# Patient Record
Sex: Female | Born: 2005 | Race: White | Hispanic: No | Marital: Single | Smoking: Never smoker
Health system: Southern US, Community
[De-identification: ages and names within clinical notes are randomized; demographics above are authoritative.]

## PROBLEM LIST (undated history)

## (undated) DIAGNOSIS — F32A Depression, unspecified: Secondary | ICD-10-CM

---
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
## 2024-01-19 ENCOUNTER — Ambulatory Visit (HOSPITAL_COMMUNITY)
Admission: RE | Admit: 2024-01-19 | Discharge: 2024-01-19 | Disposition: A | Discharge status: Home / Self Care | Payer: MEDICAID | Source: Ambulatory Visit | Attending: Family Medicine | Admitting: Family Medicine

## 2024-01-19 ENCOUNTER — Encounter (HOSPITAL_COMMUNITY): Payer: Self-pay

## 2024-01-19 VITALS — BP 126/81 | HR 80 | Temp 98.1°F | Resp 18

## 2024-01-19 DIAGNOSIS — R1013 Epigastric pain: Secondary | ICD-10-CM | POA: Diagnosis not present

## 2024-01-19 DIAGNOSIS — Z3202 Encounter for pregnancy test, result negative: Secondary | ICD-10-CM | POA: Diagnosis not present

## 2024-01-19 HISTORY — DX: Depression, unspecified: F32.A

## 2024-01-19 LAB — CBC WITH DIFFERENTIAL/PLATELET
Abs Immature Granulocytes: 0.02 10*3/uL (ref 0.00–0.07)
Basophils Absolute: 0 10*3/uL (ref 0.0–0.1)
Basophils Relative: 0 %
Eosinophils Absolute: 0.2 10*3/uL (ref 0.0–0.5)
Eosinophils Relative: 2 %
HCT: 39.9 % (ref 36.0–46.0)
Hemoglobin: 14 g/dL (ref 12.0–15.0)
Immature Granulocytes: 0 %
Lymphocytes Relative: 28 %
Lymphs Abs: 3 10*3/uL (ref 0.7–4.0)
MCH: 30.6 pg (ref 26.0–34.0)
MCHC: 35.1 g/dL (ref 30.0–36.0)
MCV: 87.1 fL (ref 80.0–100.0)
Monocytes Absolute: 0.9 10*3/uL (ref 0.1–1.0)
Monocytes Relative: 8 %
Neutro Abs: 6.5 10*3/uL (ref 1.7–7.7)
Neutrophils Relative %: 62 %
Platelets: 356 10*3/uL (ref 150–400)
RBC: 4.58 MIL/uL (ref 3.87–5.11)
RDW: 11.9 % (ref 11.5–15.5)
WBC: 10.7 10*3/uL — ABNORMAL HIGH (ref 4.0–10.5)
nRBC: 0 % (ref 0.0–0.2)

## 2024-01-19 LAB — POCT URINALYSIS DIP (MANUAL ENTRY)
Glucose, UA: NEGATIVE mg/dL
Leukocytes, UA: NEGATIVE
Nitrite, UA: NEGATIVE
Protein Ur, POC: >=300 mg/dL — AB
Spec Grav, UA: 1.025
Urobilinogen, UA: 2 U/dL — AB
pH, UA: 6

## 2024-01-19 LAB — POCT URINE PREGNANCY: Preg Test, Ur: NEGATIVE

## 2024-01-19 LAB — LIPASE, BLOOD: Lipase: 22 U/L (ref 11–51)

## 2024-01-19 LAB — COMPREHENSIVE METABOLIC PANEL
ALT: 16 U/L (ref 0–44)
AST: 17 U/L (ref 15–41)
Albumin: 3.7 g/dL (ref 3.5–5.0)
Alkaline Phosphatase: 79 U/L (ref 38–126)
Anion gap: 9 (ref 5–15)
BUN: 11 mg/dL (ref 6–20)
CO2: 22 mmol/L (ref 22–32)
Calcium: 9.5 mg/dL (ref 8.9–10.3)
Chloride: 106 mmol/L (ref 98–111)
Creatinine, Ser: 0.82 mg/dL (ref 0.44–1.00)
GFR, Estimated: >60 mL/min (ref >60)
Glucose, Bld: 89 mg/dL (ref 70–99)
Potassium: 3.5 mmol/L (ref 3.5–5.1)
Sodium: 137 mmol/L (ref 135–145)
Total Bilirubin: 0.5 mg/dL (ref 0.0–1.2)
Total Protein: 8 g/dL (ref 6.5–8.1)

## 2024-01-19 MED ORDER — FAMOTIDINE 40 MG PO TABS: 40.0000 mg | ORAL_TABLET | Freq: Every day | ORAL | 0 refills | Status: DC

## 2024-01-19 NOTE — ED Provider Notes (Signed)
 [facility]   [id number] 01/19/24 Arrival Time: 1450  ASSESSMENT & PLAN:  1. Epigastric pain    Benign abdominal exam. No indications for urgent abdominal/pelvic imaging at this time. Discussed.  Meds ordered this encounter  Medications   famotidine (PEPCID) 40 MG tablet    Sig: Take 1 tablet (40 mg total) by mouth daily.    Dispense:  30 tablet    Refill:  0     Discharge Instructions      You have been seen today for abdominal pain. Your evaluation was not suggestive of any emergent condition requiring medical intervention at this time. However, some abdominal problems make take more time to appear. Therefore, it is very important for you to pay attention to any new symptoms or worsening of your current condition.  Please return here or to the Emergency Department immediately should you begin to feel worse in any way or have any of the following symptoms: increasing or different abdominal pain, persistent vomiting, inability to drink fluids, fevers, or shaking chills.   You have had labs (blood tests) sent today. We will call you with any significant abnormalities or if there is need to begin or change treatment or pursue further follow up.  You may also review your test results online through MyChart. If you do not have a MyChart account, instructions to sign up should be on your discharge paperwork.     Follow-up Information     Schedule an appointment as soon as possible for a visit  with Cathy Sessions, NP.   Specialty: Internal Medicine Why: For follow up. Contact information: [address] [place] [address] [phone]                 Reviewed expectations re: course of current medical issues. Questions answered. Outlined signs and symptoms indicating need for more acute intervention. Patient verbalized understanding. After Visit Summary given.   SUBJECTIVE: History from: patient. Cathy Heath is a 18 y.o. female who  presents with complaint of intermittent non-radiating epigastric abdominal discomfort. Long-standing; mainly post-prandial. Mother with h/o gallbladder problems. Pt feels like she has lost some weight secondary to symptoms. Denies fever/n/v/d. Does have occasional food reflux into mouth. Denies frequent belching.  Non-smoker. Denies alcohol and recreational drug use.  No LMP recorded. Patient has had an implant. History reviewed. No pertinent surgical history.   OBJECTIVE:  Vitals:   01/19/24 1534  BP: 126/81  Pulse: 80  Resp: 18  Temp: 98.1 F (36.7 C)  TempSrc: Oral  SpO2: 98%    General appearance: alert, oriented, no acute distress HEENT: ; AT; oropharynx moist Lungs: unlabored respirations Abdomen: soft; without distention; mild  epigastric TTP; normal bowel sounds; without masses or organomegaly; without guarding or rebound tenderness Back: without reported CVA tenderness; FROM at waist Extremities: without LE edema; symmetrical; without gross deformities Skin: warm and dry Neurologic: normal gait Psychological: alert and cooperative; normal mood and affect  Labs: Results for orders placed or performed during the hospital encounter of 01/19/24  POC urinalysis dipstick   Collection Time: 01/19/24  4:13 PM  Result Value Ref Range   Color, UA yellow    Clarity, UA clear    Glucose, UA negative mg/dL   Bilirubin, UA small (A)    Ketones, POC UA small (15) (A) mg/dL   Spec Grav, UA 9.518    Blood, UA moderate (A)    pH, UA 6.0    Protein Ur, POC >=300 (A) mg/dL  Urobilinogen, UA 2.0 (A) E.U./dL   Nitrite, UA Negative    Leukocytes, UA Negative   POCT urine pregnancy   Collection Time: 01/19/24  4:15 PM  Result Value Ref Range   Preg Test, Ur Negative Negative   Labs Reviewed  POCT URINALYSIS DIP (MANUAL ENTRY) - Abnormal; Notable for the following components:      Result Value   Bilirubin, UA small (*)    Ketones, POC UA small (15) (*)    Blood, UA moderate  (*)    Protein Ur, POC >=300 (*)    Urobilinogen, UA 2.0 (*)    All other components within normal limits  CBC WITH DIFFERENTIAL/PLATELET  COMPREHENSIVE METABOLIC PANEL  LIPASE, BLOOD  POCT URINE PREGNANCY    Imaging: No results found.   No Known Allergies                                             Past Medical History:  Diagnosis Date   Depression     Social History   Socioeconomic History   Marital status: Single    Spouse name: Not on file   Number of children: Not on file   Years of education: Not on file   Highest education level: Not on file  Occupational History   Not on file  Tobacco Use   Smoking status: Never   Smokeless tobacco: Never  Vaping Use   Vaping status: Every Day  Substance and Sexual Activity   Alcohol use: Yes    Comment: rare   Drug use: Yes    Types: Marijuana    Comment: 1-2 per week   Sexual activity: Yes    Partners: Male    Birth control/protection: Implant  Other Topics Concern   Not on file  Social History Narrative   Not on file   Social Drivers of Health   Financial Resource Strain: Not on file  Food Insecurity: No Food Insecurity (11/11/2023)   Hunger Vital Sign    Worried About Running Out of Food in the Last Year: Never true    Ran Out of Food in the Last Year: Never true  Transportation Needs: No Transportation Needs (11/11/2023)   PRAPARE - Administrator, Civil Service (Medical): No    Lack of Transportation (Non-Medical): No  Physical Activity: Not on file  Stress: Not on file  Social Connections: Not on file  Intimate Partner Violence: Not At Risk (11/11/2023)   Humiliation, Afraid, Rape, and Kick questionnaire    Fear of Current or Ex-Partner: No    Emotionally Abused: No    Physically Abused: No    Sexually Abused: No    Family History  Problem Relation Age of Onset   Healthy Mother    Hypertension Father      Mardella Layman, MD 01/19/24 [phone]

## 2024-01-19 NOTE — Discharge Instructions (Signed)

## 2024-01-19 NOTE — ED Triage Notes (Signed)
 Pt states that since last summer she has abdominal pain when she eats certain things. She states it takes a long time to get into her pcp so she just came here. She states that her mom wants her gallbladder checked. She has been taking tylenol and IBU

## 2024-01-28 ENCOUNTER — Other Ambulatory Visit: Payer: Self-pay

## 2024-01-28 ENCOUNTER — Emergency Department (HOSPITAL_COMMUNITY): Payer: MEDICAID

## 2024-01-28 ENCOUNTER — Encounter (HOSPITAL_COMMUNITY): Payer: Self-pay

## 2024-01-28 ENCOUNTER — Emergency Department (HOSPITAL_COMMUNITY)
Admission: EM | Admit: 2024-01-28 | Discharge: 2024-01-29 | Disposition: A | Discharge status: Home / Self Care | Payer: MEDICAID | Attending: Emergency Medicine | Admitting: Emergency Medicine

## 2024-01-28 DIAGNOSIS — Y92410 Unspecified street and highway as the place of occurrence of the external cause: Secondary | ICD-10-CM | POA: Insufficient documentation

## 2024-01-28 DIAGNOSIS — S0990XA Unspecified injury of head, initial encounter: Secondary | ICD-10-CM | POA: Diagnosis present

## 2024-01-28 DIAGNOSIS — M542 Cervicalgia: Secondary | ICD-10-CM | POA: Insufficient documentation

## 2024-01-28 MED ORDER — ACETAMINOPHEN 325 MG PO TABS
650.0000 mg | ORAL_TABLET | Freq: Once | ORAL | Status: AC
  Administered 2024-01-28: 650 mg via ORAL
  Filled 2024-01-28: qty 2
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
## 2024-01-29 MED ORDER — KETOROLAC TROMETHAMINE 60 MG/2ML IM SOLN
30.0000 mg | Freq: Once | INTRAMUSCULAR | Status: AC
  Administered 2024-01-29: 30 mg via INTRAMUSCULAR
  Filled 2024-01-29: qty 2

## 2024-01-29 NOTE — Discharge Instructions (Addendum)
 For pain control you may take 1000 mg of acetaminophen (Tylenol) every 8 hours and/or 600 mg of Ibuprofen (Motrin, Advil, etc.) every 6-8 hours as needed.  Please limit acetaminophen (Tylenol) to 4000 mg and Ibuprofen (Motrin, Advil, etc.) to 2400 mg for a 24hr period. Please note that other over-the-counter medicine may contain acetaminophen or ibuprofen as a component of their ingredients.
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
## 2024-02-11 ENCOUNTER — Ambulatory Visit (HOSPITAL_COMMUNITY)
Admission: EM | Admit: 2024-02-11 | Discharge: 2024-02-11 | Disposition: A | Discharge status: Home / Self Care | Payer: MEDICAID | Attending: Family Medicine | Admitting: Family Medicine

## 2024-02-11 ENCOUNTER — Encounter (HOSPITAL_COMMUNITY): Payer: Self-pay | Admitting: Emergency Medicine

## 2024-02-11 ENCOUNTER — Other Ambulatory Visit: Payer: Self-pay

## 2024-02-11 DIAGNOSIS — J95830 Postprocedural hemorrhage and hematoma of a respiratory system organ or structure following a respiratory system procedure: Secondary | ICD-10-CM

## 2024-02-11 NOTE — ED Provider Notes (Signed)
 [facility]    CSN: [id number] Arrival date & time: 02/11/24  1308      History   Chief Complaint Chief Complaint  Patient presents with   Hemoptysis    HPI Cathy Heath is a 18 y.o. female.   The patient reports some trace episodes of spitting up blood after having her tonsils removed surgically 6 days ago. She found a small ulceration in the back of her mouth that is not actively bleeding but she is worried about complications with the surgery. She denies any fevers, chills, foul tasting discharge, neck stiffness, difficulty swallowing or breathing, chest pain, warmth, or redness.   The history is provided by the patient.    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 No pertinent surgical history.  OB History     Gravida  0   Para  0   Term  0   Preterm  0   AB  0   Living  0      SAB  0   IAB  0   Ectopic  0   Multiple  0   Live Births  0            Home Medications    Prior to Admission medications   Medication Sig Start Date End Date Taking? Authorizing Provider  Azelastine-Fluticasone (DYMISTA) 137-50 MCG/ACT SUSP Place 1 spray into the nose every 12 (twelve) hours. 09/04/23 10/04/23  Theadora Rama Scales, PA-C  etonogestrel (NEXPLANON) 68 MG IMPL implant 68 mg by subdermal route once.    [provider]  famotidine (PEPCID) 40 MG tablet Take 1 tablet (40 mg total) by mouth daily. 01/19/24   Mardella Layman, MD  ibuprofen (ADVIL) 800 MG tablet Take 1 tablet (800 mg total) by mouth every 8 (eight) hours as needed (pain). 11/01/23   Zenia Resides, MD   levocetirizine (XYZAL) 5 MG tablet Take 1 tablet (5 mg total) by mouth every evening. 09/04/23 03/02/24  Theadora Rama Scales, PA-C    Family History Family History  Problem Relation Age of Onset   Healthy Mother    Hypertension Father     Social History Social History   Tobacco Use   Smoking status: Never   Smokeless tobacco: Never  Vaping Use   Vaping status: Every Day  Substance Use Topics   Alcohol use: Yes    Comment: rare   Drug use: Yes    Types: Marijuana    Comment: 1-2 per week     Allergies   Patient has no known allergies.   Review of Systems Review of Systems  Constitutional:  Negative for appetite change, chills, diaphoresis, fatigue and fever.  HENT:  Positive for mouth sores and sore throat (pain controlled). Negative for drooling, facial swelling, trouble swallowing and voice change.   Respiratory:  Negative for shortness of breath and stridor.   Cardiovascular:  Negative for chest pain.  Gastrointestinal:  Negative for nausea and vomiting.  Musculoskeletal:  Negative for neck pain and neck stiffness.  Skin:  Negative for color change.  Neurological:  Negative for dizziness, facial asymmetry, speech difficulty and light-headedness.  Hematological:  Does not bruise/bleed easily.     Physical Exam Triage Vital Signs ED Triage Vitals [02/11/24 0819]  Encounter Vitals Group     BP 131/89     Systolic BP Percentile      Diastolic BP Percentile      Pulse Rate 80     Resp 16     Temp 98.3 F (36.8 C)     Temp Source Oral     SpO2 98 %     Weight      Height      Head Circumference      Peak Flow      Pain Score 9     Pain Loc      Pain Education      Exclude from Growth Chart    No data found.  Updated Vital Signs BP 131/89 (BP Location: Right Arm)   Pulse 80   Temp 98.3 F (36.8 C) (Oral)   Resp 16   SpO2 98%   Visual Acuity Right Eye Distance:   Left Eye Distance:   Bilateral Distance:    Right Eye Near:   Left Eye  Near:    Bilateral Near:     Physical Exam Vitals reviewed.  Constitutional:      General: She is not in acute distress.    Appearance: Normal appearance. She is not ill-appearing, toxic-appearing or diaphoretic.  HENT:     Head: Normocephalic and atraumatic.     Nose: Nose normal.     Mouth/Throat:     Comments: Cauterized tissue of the soft palate and retro tonsilar mucosa  All structures midline No uvular deviation No active hemorrhage or purulent drainage 1mm, superficial abrasion of the soft palat right lateral to the uvula w/ a small vessel visible No other ulceration or edema Eyes:     Extraocular Movements: Extraocular movements intact.     Pupils: Pupils are equal, round, and reactive to light.  Cardiovascular:     Pulses: Normal pulses.  Pulmonary:     Effort: Pulmonary effort is normal.     Breath sounds: No stridor. No wheezing.  Musculoskeletal:     Cervical back: Normal range of motion and neck supple. No rigidity or tenderness.  Lymphadenopathy:     Cervical: No cervical adenopathy.  Skin:    Capillary Refill: Capillary refill takes 2 to 3 seconds.     Findings: No erythema.  Neurological:     Mental Status: She is alert.      UC Treatments / Results  Labs (all labs ordered are listed, but only abnormal results are displayed) Labs Reviewed - No data to display  EKG   Radiology No results found.  Procedures Procedures (including critical care time)  Medications Ordered in UC Medications - No data to display  Initial Impression / Assessment and Plan / UC Course  I have reviewed the triage vital signs and the nursing notes.  Pertinent labs & imaging results that were available during my care of the patient were reviewed by me and considered in my medical decision making (see chart for details).     Hemorrhage sp tonsillectomy, resolved, stable - The patient is not actively bleeding and there is a small healing ulcer within the cauterized  tissue of the soft palat that has a very small vessel present.  - I discussed the risk of bleeding with the patient and the need to take caution with oral intake now that she is trying to eat solid food -  She can continue the current pain medication for management - If she has any further complications she can follow up with her ENT. We discussed return criteria to urgent care/the ED including further bleeding, or sxs leading to compromise of the airway or infection.  - The patient voiced understanding and agreement. All questions were answered.   Final Clinical Impressions(s) / UC Diagnoses   Final diagnoses:  Post tonsillectomy secondary hemorrhage     Discharge Instructions      You have a small area where your tonsil surgery was done that has a risk of bleeding.  Be very careful when eating soft foods.  If you have continuous bleeding go to the emergency room If you have recurrent, intermittent small amounts of blood, set up a follow up appointment with your ENT that performed the surgery Continue your prescribed pain medication     ED Prescriptions   None    PDMP not reviewed this encounter.   Ivor Messier, MD 02/11/24 [phone]

## 2024-02-11 NOTE — Discharge Instructions (Signed)
 You have a small area where your tonsil surgery was done that has a risk of bleeding.  Be very careful when eating soft foods.  If you have continuous bleeding go to the emergency room If you have recurrent, intermittent small amounts of blood, set up a follow up appointment with your ENT that performed the surgery Continue your prescribed pain medication

## 2024-02-11 NOTE — ED Triage Notes (Signed)
 Pt reports having a tonsillectomy 6 days ago and started spitting blood today.

## 2024-02-29 ENCOUNTER — Encounter (HOSPITAL_COMMUNITY): Payer: Self-pay | Admitting: *Deleted

## 2024-02-29 ENCOUNTER — Other Ambulatory Visit: Payer: Self-pay

## 2024-02-29 ENCOUNTER — Ambulatory Visit (HOSPITAL_COMMUNITY)
Admission: EM | Admit: 2024-02-29 | Discharge: 2024-02-29 | Disposition: A | Discharge status: Home / Self Care | Payer: MEDICAID | Attending: Emergency Medicine | Admitting: Emergency Medicine

## 2024-02-29 DIAGNOSIS — B850 Pediculosis due to Pediculus humanus capitis: Secondary | ICD-10-CM | POA: Diagnosis not present

## 2024-02-29 MED ORDER — NIX CREME RINSE 1 % EX LIQD: 1.0000 | Freq: Once | CUTANEOUS | 0 refills | Status: AC

## 2024-02-29 NOTE — ED Provider Notes (Signed)
 [facility]    CSN: [id number] Arrival date & time: 02/29/24  1557      History   Chief Complaint Chief Complaint  Patient presents with   Head Lice    HPI Cathy Heath is a 18 y.o. female.  Here with concerns for head lice Noticed when she washed her hair yesterday. Showed this provider a photo of 3-4 small black bugs in the tub. 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 No pertinent surgical history.  OB History     Gravida  0   Para  0   Term  0   Preterm  0   AB  0   Living  0      SAB  0   IAB  0   Ectopic  0   Multiple  0   Live Births  0            Home Medications    Prior to Admission medications   Medication Sig Start Date End Date Taking? Authorizing Provider  etonogestrel  (NEXPLANON ) 68 MG IMPL implant 68 mg by subdermal route once.   Yes [provider]  permethrin (NIX CREME RINSE) 1 % external liquid Apply 1 Application topically once for 1 dose. Use as directed 02/29/24 02/29/24 Yes Mindy Gali, Ivette Marks, PA-C    Family History Family History  Problem Relation Age of Onset   Healthy Mother    Hypertension Father     Social History Social History   Tobacco Use   Smoking status: Never   Smokeless tobacco: Never  Vaping Use   Vaping status: Every Day  Substance Use Topics   Alcohol use: Yes    Comment: rare   Drug use: Yes    Types: Marijuana    Comment: 1-2 per week     Allergies   Patient has no known allergies.   Review of  Systems Review of Systems Per HPI  Physical Exam Triage Vital Signs ED Triage Vitals  Encounter Vitals Group     BP      Systolic BP Percentile      Diastolic BP Percentile      Pulse      Resp      Temp      Temp src      SpO2      Weight      Height      Head Circumference      Peak Flow      Pain Score      Pain Loc      Pain Education      Exclude from Growth Chart    No data found.  Updated Vital Signs BP 119/76   Pulse 83   Temp (!) 97.4 F (36.3 C)   Resp 16   Wt 232 lb 12.8 oz (105.6 kg)   SpO2 97%   BMI 37.57 kg/m    Physical Exam Vitals and nursing note reviewed.  Constitutional:      General: She is not in acute  distress.    Appearance: Normal appearance.  HENT:     Head:     Comments: Dandruff (flakes off) but no obvious nits or lice    Mouth/Throat:     Pharynx: Oropharynx is clear.  Cardiovascular:     Rate and Rhythm: Normal rate and regular rhythm.     Pulses: Normal pulses.     Heart sounds: Normal heart sounds.  Pulmonary:     Effort: Pulmonary effort is normal.     Breath sounds: Normal breath sounds.  Abdominal:     Palpations: Abdomen is soft.  Neurological:     Mental Status: She is alert and oriented to person, place, and time.     UC Treatments / Results  Labs (all labs ordered are listed, but only abnormal results are displayed) Labs Reviewed - No data to display  EKG  Radiology No results found.  Procedures Procedures (including critical care time)  Medications Ordered in UC Medications - No data to display  Initial Impression / Assessment and Plan / UC Course  I have reviewed the triage vital signs and the nursing notes.  Pertinent labs & imaging results that were available during my care of the patient were reviewed by me and considered in my medical decision making (see chart for details).  Treat for possible head lice with Nix permethrin liquid, single application. Repeat in 1 week if needed  Final  Clinical Impressions(s) / UC Diagnoses   Final diagnoses:  Head lice     Discharge Instructions      After hair has been washed with shampoo, rinsed with water and towel dried, apply a sufficient volume of Nix liquid to saturate the hair and scalp. Also apply behind the ears and at the base of the neck. Leave on hair for 10 minutes before rinsing off with warm water; remove remaining lice with comb. May repeat application in 7 to 10 days if live lice or nits observed.     ED Prescriptions     Medication Sig Dispense Auth. Provider   permethrin (NIX CREME RINSE) 1 % external liquid Apply 1 Application topically once for 1 dose. Use as directed 59 mL [name], Ivette Marks, PA-C      PDMP not reviewed this encounter.   Creighton Doffing, [place] 02/29/24 1610

## 2024-02-29 NOTE — ED Triage Notes (Signed)
 PT reports she saw head lice after she washed her hair last night.

## 2024-02-29 NOTE — Discharge Instructions (Addendum)
 After hair has been washed with shampoo, rinsed with water and towel dried, apply a sufficient volume of Nix liquid to saturate the hair and scalp. Also apply behind the ears and at the base of the neck. Leave on hair for 10 minutes before rinsing off with warm water; remove remaining lice with comb. May repeat application in 7 to 10 days if live lice or nits observed.

## 2024-02-29 NOTE — ED Triage Notes (Signed)
 PT DOB and full namr confirmed at triage.

## 2024-05-09 ENCOUNTER — Emergency Department (HOSPITAL_COMMUNITY)
Admission: EM | Admit: 2024-05-09 | Discharge: 2024-05-10 | Disposition: A | Discharge status: Home / Self Care | Payer: MEDICAID | Attending: Emergency Medicine | Admitting: Emergency Medicine

## 2024-05-09 ENCOUNTER — Emergency Department (HOSPITAL_COMMUNITY): Payer: MEDICAID

## 2024-05-09 DIAGNOSIS — M25522 Pain in left elbow: Secondary | ICD-10-CM | POA: Diagnosis present

## 2024-05-09 DIAGNOSIS — Z23 Encounter for immunization: Secondary | ICD-10-CM | POA: Insufficient documentation

## 2024-05-09 DIAGNOSIS — S53402A Unspecified sprain of left elbow, initial encounter: Secondary | ICD-10-CM | POA: Insufficient documentation

## 2024-05-09 DIAGNOSIS — S5002XA Contusion of left elbow, initial encounter: Secondary | ICD-10-CM | POA: Diagnosis not present
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
## 2024-05-10 MED ORDER — TETANUS-DIPHTH-ACELL PERTUSSIS 5-2.5-18.5 LF-MCG/0.5 IM SUSY
0.5000 mL | PREFILLED_SYRINGE | Freq: Once | INTRAMUSCULAR | Status: AC
  Administered 2024-05-10: 0.5 mL via INTRAMUSCULAR
  Filled 2024-05-10: qty 0.5

## 2024-05-10 MED ORDER — IBUPROFEN 400 MG PO TABS
400.0000 mg | ORAL_TABLET | Freq: Once | ORAL | Status: AC
  Administered 2024-05-10: 400 mg via ORAL
  Filled 2024-05-10: qty 1
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
## 2024-05-23 ENCOUNTER — Telehealth (INDEPENDENT_AMBULATORY_CARE_PROVIDER_SITE_OTHER): Payer: Self-pay | Admitting: Primary Care

## 2024-05-23 NOTE — Telephone Encounter (Signed)
 Called pt to confirm appt. Pt will be present.

## 2024-05-24 ENCOUNTER — Encounter (INDEPENDENT_AMBULATORY_CARE_PROVIDER_SITE_OTHER): Payer: Self-pay | Admitting: Primary Care

## 2024-05-24 ENCOUNTER — Ambulatory Visit (INDEPENDENT_AMBULATORY_CARE_PROVIDER_SITE_OTHER): Payer: MEDICAID | Admitting: Primary Care

## 2024-05-24 ENCOUNTER — Other Ambulatory Visit (HOSPITAL_COMMUNITY)
Admission: RE | Admit: 2024-05-24 | Discharge: 2024-05-24 | Disposition: A | Discharge status: Home / Self Care | Payer: MEDICAID | Source: Ambulatory Visit | Attending: Primary Care | Admitting: Primary Care

## 2024-05-24 VITALS — BP 134/84 | HR 56 | Resp 16 | Ht 66.0 in | Wt 229.0 lb

## 2024-05-24 DIAGNOSIS — Z118 Encounter for screening for other infectious and parasitic diseases: Secondary | ICD-10-CM | POA: Insufficient documentation

## 2024-05-24 DIAGNOSIS — Z09 Encounter for follow-up examination after completed treatment for conditions other than malignant neoplasm: Secondary | ICD-10-CM

## 2024-05-24 DIAGNOSIS — I10 Essential (primary) hypertension: Secondary | ICD-10-CM

## 2024-05-24 MED ORDER — HYDROCHLOROTHIAZIDE 25 MG PO TABS: 25.0000 mg | ORAL_TABLET | Freq: Every day | ORAL | 1 refills | Status: AC
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
## 2024-05-24 NOTE — Patient Instructions (Signed)
 Hydrochlorothiazide  Capsules or Tablets What is this medication? HYDROCHLOROTHIAZIDE  (hye DROE klor oh THYE a zide ) treats high blood pressure. It may also be used to reduce swelling related to heart, kidney, or liver disease. It works by helping your kidneys remove more fluid and salt from your blood through the urine. It belongs to a group of medications called diuretics. This medicine may be used for other purposes; ask your health care provider or pharmacist if you have questions. COMMON BRAND NAME(S): Esidrix , Ezide, HydroDIURIL , Microzide , Oretic , Zide  What should I tell my care team before I take this medication? They need to know if you have any of these conditions: Diabetes Gout Kidney disease Liver disease Lupus An unusual or allergic reaction to hydrochlorothiazide , other medications, foods, dyes, or preservatives Pregnant or trying to get pregnant Breastfeeding How should I use this medication? Take this medication by mouth. Take it as directed on the prescription label at the same time every day. You can take it with or without food. If it upsets your stomach, take it with food. Keep taking it unless your care team tells you to stop. Talk to your care team about the use of this medication in children. While it may be prescribed for children for selected conditions, precautions do apply. Overdosage: If you think you have taken too much of this medicine contact a poison control center or emergency room at once. NOTE: This medicine is only for you. Do not share this medicine with others. What if I miss a dose? If you miss a dose, take it as soon as you can. If it is almost time for your next dose, take only that dose. Do not take double or extra doses. What may interact with this medication? Do not take this medication with any of the following: Cidofovir Dofetilide Tranylcypromine This medication may also interact with the  following: Cholestyramine Colestipol Digoxin Lithium Medications for diabetes NSAIDS, medications for pain and inflammation, such as ibuprofen  or naproxen Other medications for blood pressure This list may not describe all possible interactions. Give your health care provider a list of all the medicines, herbs, non-prescription drugs, or dietary supplements you use. Also tell them if you smoke, drink alcohol, or use illegal drugs. Some items may interact with your medicine. What should I watch for while using this medication? Visit your care team for regular checks on your progress. Check your blood pressure as directed. Know what your blood pressure should be and when to contact your care team. This medication may affect your coordination, reaction time, or judgment. Do not drive or operate machinery until you know how this medication affects you. Sit up or stand slowly to reduce the risk of dizzy or fainting spells. Drinking alcohol with this medication can increase the risk of these side effects. Taking this medication is only part of a total heart healthy program. Ask your care team if there are other changes you can make to improve your overall health. Do not treat yourself for coughs, colds, or pain while you are using this medication without asking your care team for advice. Some medications may increase your blood pressure. Talk to your care team about your risk of skin cancer. You may be more at risk for skin cancer if you take this medication. To lower your risk, keep out of the sun. If you cannot avoid being in the sun, wear protective clothing and sunscreen. Do not use sun lamps, tanning beds, or tanning booths. This medication may increase blood sugar.  The risk may be higher in patients who already have diabetes. Ask your care team what you can do to lower your risk of diabetes while taking this medication. What side effects may I notice from receiving this medication? Side effects that  you should report to your care team as soon as possible: Allergic reactions--skin rash, itching, hives, swelling of the face, lips, tongue, or throat Dehydration--increased thirst, dry mouth, feeling faint or lightheaded, headache, dark yellow or brown urine Gout--severe pain, redness, warmth, or swelling in joints, such as the big toe Kidney injury--decrease in the amount of urine, swelling of the ankles, hands, or feet Low blood pressure--dizziness, feeling faint or lightheaded, blurry vision Low potassium level--muscle pain or cramps, unusual weakness, fatigue, fast or irregular heartbeat, constipation Sudden eye pain or change in vision such as blurred vision, seeing halos around lights, vision loss Side effects that usually do not require medical attention (report to your care team if they continue or are bothersome): Change in sex drive or performance Headache Upset stomach This list may not describe all possible side effects. Call your doctor for medical advice about side effects. You may report side effects to FDA at 1-800-FDA-1088. Where should I keep my medication? Keep out of the reach of children and pets. Store at room temperature between 20 and 25 degrees C (68 and 77 degrees F). Protect from light and moisture. Keep the container tightly closed. Do not freeze. Get rid of any unused medication after the expiration date. To get rid of medications that are no longer needed or have expired: Take the medication to a take-back program. Check with your pharmacy or law enforcement to find a location. If you cannot return the medication, check the label or package insert to see if the medication should be thrown out in the garbage or flushed down the toilet. If you are not sure, ask your care team. If it is safe to put it in the trash, empty the medication out of the container. Mix it with cat litter, dirt, coffee grounds, or another unwanted substance. Seal the mixture in a bag or container.  Put it in the trash. NOTE: This sheet is a summary. It may not cover all possible information. If you have questions about this medicine, talk to your doctor, pharmacist, or health care provider.  2025 Elsevier/Gold Standard (2023-12-18 00:00:00)

## 2024-05-26 LAB — CERVICOVAGINAL ANCILLARY ONLY
Bacterial Vaginitis (gardnerella): NEGATIVE
Candida Glabrata: NEGATIVE
Candida Vaginitis: NEGATIVE
Chlamydia: NEGATIVE
Comment: NEGATIVE
Comment: NEGATIVE
Comment: NEGATIVE
Comment: NEGATIVE
Comment: NEGATIVE
Comment: NORMAL
Neisseria Gonorrhea: NEGATIVE
Trichomonas: NEGATIVE

## 2024-05-28 ENCOUNTER — Ambulatory Visit: Payer: Self-pay | Admitting: Primary Care

## 2024-06-22 ENCOUNTER — Ambulatory Visit (INDEPENDENT_AMBULATORY_CARE_PROVIDER_SITE_OTHER): Payer: Self-pay | Admitting: Primary Care

## 2024-06-27 ENCOUNTER — Telehealth (INDEPENDENT_AMBULATORY_CARE_PROVIDER_SITE_OTHER): Payer: Self-pay | Admitting: Primary Care

## 2024-06-27 NOTE — Telephone Encounter (Signed)
 Called pt to confirm appt. Pt will be present.

## 2024-06-28 ENCOUNTER — Ambulatory Visit (INDEPENDENT_AMBULATORY_CARE_PROVIDER_SITE_OTHER): Payer: Self-pay | Admitting: Primary Care

## 2024-07-07 ENCOUNTER — Ambulatory Visit (HOSPITAL_COMMUNITY)
Admission: RE | Admit: 2024-07-07 | Discharge: 2024-07-07 | Disposition: A | Discharge status: Home / Self Care | Payer: MEDICAID | Source: Ambulatory Visit | Attending: Family Medicine | Admitting: Family Medicine

## 2024-07-07 ENCOUNTER — Encounter (HOSPITAL_COMMUNITY): Payer: Self-pay

## 2024-07-07 VITALS — BP 132/88 | HR 82 | Temp 97.7°F | Resp 17

## 2024-07-07 DIAGNOSIS — J069 Acute upper respiratory infection, unspecified: Secondary | ICD-10-CM | POA: Diagnosis not present

## 2024-07-07 DIAGNOSIS — N939 Abnormal uterine and vaginal bleeding, unspecified: Secondary | ICD-10-CM

## 2024-07-07 LAB — POCT URINE PREGNANCY: Preg Test, Ur: NEGATIVE

## 2024-07-07 LAB — POC SARS CORONAVIRUS 2 AG -  ED: SARS Coronavirus 2 Ag: POSITIVE — AB

## 2024-07-07 MED ORDER — MEDROXYPROGESTERONE ACETATE 10 MG PO TABS: 10.0000 mg | ORAL_TABLET | Freq: Every day | ORAL | 0 refills | Status: AC

## 2024-07-07 MED ORDER — BENZONATATE 100 MG PO CAPS: 100.0000 mg | ORAL_CAPSULE | Freq: Three times a day (TID) | ORAL | 0 refills | Status: AC | PRN

## 2024-07-07 NOTE — Discharge Instructions (Signed)
 The COVID test is positive  The urine pregnancy test is negative  Take benzonatate  100 mg, 1 tab every 8 hours as needed for cough.  Take medroxyprogesterone  10 mg--1 tablet daily for 7 days.  Hopefully this medication will help you stop bleeding; then you should expect a withdrawal bleed 3 to 4 days after you finish this medication.  You can follow-up with your regular primary care doctor about the bleeding, or you can call the GYN office where you had your Nexplanon  placed.  This is listed in this paperwork.

## 2024-07-07 NOTE — ED Provider Notes (Signed)
 [facility]    CSN: [id number] Arrival date & time: 07/07/24  1658      History   Chief Complaint Chief Complaint  Patient presents with   5pm appt     HPI Cathy Heath is a 18 y.o. female.   HPI Here for cough, nasal congestion and rhinorrhea.  Symptoms began on August 31.  No fever or chills and no nausea vomiting or diarrhea.  No chest pain and no shortness of breath.  She also notes having had a menstrual cycle since August 10.  She had not had a cycle in close to 2 years, since she had had a Nexplanon  placed in late 2023.  No cramping  The bleeding is heavy at times.  No dizziness.   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 No pertinent surgical history.  OB History     Gravida  0   Para  0   Term  0   Preterm  0   AB  0   Living  0      SAB  0   IAB  0   Ectopic  0   Multiple  0   Live Births  0            Home Medications    Prior to Admission medications   Medication Sig Start Date End Date Taking? Authorizing Provider  benzonatate  (TESSALON ) 100 MG capsule Take 1 capsule (100 mg total) by mouth 3 (three) times daily as needed for cough. 07/07/24  Yes Vonna Sharlet POUR, MD  medroxyPROGESTERone  (PROVERA ) 10 MG tablet Take 1 tablet (10 mg total) by mouth daily for 10 days. 07/07/24 07/17/24 Yes Kimo Bancroft, Sharlet POUR, MD  etonogestrel  (NEXPLANON ) 68 MG IMPL implant 68 mg by subdermal route once.    [provider]  hydrochlorothiazide  (HYDRODIURIL ) 25 MG tablet Take 1 tablet (25 mg total) by mouth daily. 05/24/24   Celestia Rosaline SQUIBB, NP    Family  History Family History  Problem Relation Age of Onset   Healthy Mother    Hypertension Father     Social History Social History   Tobacco Use   Smoking status: Never   Smokeless tobacco: Never  Vaping Use   Vaping status: Every Day  Substance Use Topics   Alcohol use: Yes    Comment: rare   Drug use: Yes    Types: Marijuana    Comment: 1-2 per week     Allergies   Patient has no known allergies.   Review of Systems Review of Systems   Physical Exam Triage Vital Signs ED Triage Vitals  Encounter Vitals Group     BP 07/07/24 1716 132/88     Girls Systolic BP Percentile --      Girls Diastolic BP Percentile --      Boys Systolic BP Percentile --      Boys Diastolic BP Percentile --      Pulse Rate 07/07/24 1716 82     Resp 07/07/24 1716 17     Temp 07/07/24 1716 97.7 F (36.5 C)     Temp Source 07/07/24 1716 Oral  SpO2 07/07/24 1716 95 %     Weight --      Height --      Head Circumference --      Peak Flow --      Pain Score 07/07/24 1714 0     Pain Loc --      Pain Education --      Exclude from Growth Chart --    No data found.  Updated Vital Signs BP 132/88 (BP Location: Left Arm)   Pulse 82   Temp 97.7 F (36.5 C) (Oral)   Resp 17   LMP 06/12/2024   SpO2 95%   Visual Acuity Right Eye Distance:   Left Eye Distance:   Bilateral Distance:    Right Eye Near:   Left Eye Near:    Bilateral Near:     Physical Exam Vitals reviewed.  Constitutional:      General: She is not in acute distress.    Appearance: She is not toxic-appearing.  HENT:     Right Ear: Tympanic membrane and ear canal normal.     Left Ear: Tympanic membrane and ear canal normal.     Nose: Congestion present.     Mouth/Throat:     Mouth: Mucous membranes are moist.     Comments: There is some white exudate draining from oropharynx Eyes:     Extraocular Movements: Extraocular movements intact.     Conjunctiva/sclera: Conjunctivae normal.     Pupils: Pupils are  equal, round, and reactive to light.  Cardiovascular:     Rate and Rhythm: Normal rate and regular rhythm.     Heart sounds: No murmur heard. Pulmonary:     Effort: Pulmonary effort is normal. No respiratory distress.     Breath sounds: No stridor. No wheezing, rhonchi or rales.  Musculoskeletal:     Cervical back: Neck supple.  Lymphadenopathy:     Cervical: No cervical adenopathy.  Skin:    Capillary Refill: Capillary refill takes less than 2 seconds.     Coloration: Skin is not jaundiced or pale.  Neurological:     General: No focal deficit present.     Mental Status: She is alert and oriented to person, place, and time.  Psychiatric:        Behavior: Behavior normal.      UC Treatments / Results  Labs (all labs ordered are listed, but only abnormal results are displayed) Labs Reviewed  POC SARS CORONAVIRUS 2 AG -  ED - Abnormal; Notable for the following components:      Result Value   SARS Coronavirus 2 Ag Positive (*)    All other components within normal limits  POCT URINE PREGNANCY    EKG   Radiology No results found.  Procedures Procedures (including critical care time)  Medications Ordered in UC Medications - No data to display  Initial Impression / Assessment and Plan / UC Course  I have reviewed the triage vital signs and the nursing notes.  Pertinent labs & imaging results that were available during my care of the patient were reviewed by me and considered in my medical decision making (see chart for details).     COVID test is positive. UPT is negative  Tessalon  Perles are sent in for cough and oxygen and Provera  to see if that will help reduce the menstrual bleeding. I have asked her to follow-up with her primary care or with the OB/GYN who placed Nexplanon .  The patient states she could possibly  remember who placed the Nexplanon , so that information is in place in her discharge summary  Final Clinical Impressions(s) / UC Diagnoses   Final  diagnoses:  Viral URI  Abnormal uterine bleeding     Discharge Instructions      The COVID test is positive  The urine pregnancy test is negative  Take benzonatate  100 mg, 1 tab every 8 hours as needed for cough.  Take medroxyprogesterone  10 mg--1 tablet daily for 7 days.  Hopefully this medication will help you stop bleeding; then you should expect a withdrawal bleed 3 to 4 days after you finish this medication.  You can follow-up with your regular primary care doctor about the bleeding, or you can call the GYN office where you had your Nexplanon  placed.  This is listed in this paperwork.     ED Prescriptions     Medication Sig Dispense Auth. Provider   benzonatate  (TESSALON ) 100 MG capsule Take 1 capsule (100 mg total) by mouth 3 (three) times daily as needed for cough. 21 capsule Vonna Sharlet POUR, MD   medroxyPROGESTERone  (PROVERA ) 10 MG tablet Take 1 tablet (10 mg total) by mouth daily for 10 days. 10 tablet Vonna Kingslee Dowse K, MD      PDMP not reviewed this encounter.   Vonna Sharlet POUR, MD 07/07/24 [phone]

## 2024-07-07 NOTE — ED Notes (Signed)
 Reviewed work note

## 2024-07-07 NOTE — ED Triage Notes (Signed)
 Pt reports for 3 days having nasal congestion, ear clogged, sore throat. Took Nyquil the other night for symptoms. Denies pain at this time.   Pt reports has nexplonon and hasn't had a cycle in about 2 years. Reports started having vaginal bleeding on 8/10 and still having her menstrual cycle. Pt reports that changing tampons every 4 hours. Denies any cramping or passing any clots.

## 2024-07-08 ENCOUNTER — Telehealth (HOSPITAL_COMMUNITY): Payer: Self-pay

## 2024-07-08 MED ORDER — PROMETHAZINE-DM 6.25-15 MG/5ML PO SYRP: 5.0000 mL | ORAL_SOLUTION | Freq: Four times a day (QID) | ORAL | 0 refills | Status: DC | PRN

## 2024-07-08 NOTE — Telephone Encounter (Signed)
 Pt called stating tessalon  perles were not covered by insurance. Requested alternative.  Spoke with JUDITHANN Nose, FNP, who recommended promethazine  DM or OTC Delsym/Robitussin.  Reviewed with patient, verified pharmacy, prescription sent

## 2024-10-04 ENCOUNTER — Ambulatory Visit (HOSPITAL_COMMUNITY)
Admission: EM | Admit: 2024-10-04 | Discharge: 2024-10-04 | Disposition: A | Discharge status: Home / Self Care | Payer: MEDICAID | Attending: Internal Medicine | Admitting: Internal Medicine

## 2024-10-04 ENCOUNTER — Encounter (HOSPITAL_COMMUNITY): Payer: Self-pay | Admitting: Emergency Medicine

## 2024-10-04 ENCOUNTER — Ambulatory Visit (HOSPITAL_COMMUNITY): Payer: Self-pay

## 2024-10-04 DIAGNOSIS — Z720 Tobacco use: Secondary | ICD-10-CM

## 2024-10-04 DIAGNOSIS — J069 Acute upper respiratory infection, unspecified: Secondary | ICD-10-CM

## 2024-10-04 LAB — POC COVID19/FLU A&B COMBO
Covid Antigen, POC: NEGATIVE
Influenza A Antigen, POC: NEGATIVE
Influenza B Antigen, POC: NEGATIVE

## 2024-10-04 LAB — POCT RAPID STREP A (OFFICE): Rapid Strep A Screen: NEGATIVE

## 2024-10-04 MED ORDER — PROMETHAZINE-DM 6.25-15 MG/5ML PO SYRP: 5.0000 mL | ORAL_SOLUTION | Freq: Four times a day (QID) | ORAL | 0 refills | Status: AC | PRN

## 2024-10-04 MED ORDER — GUAIFENESIN ER 1200 MG PO TB12: 1200.0000 mg | ORAL_TABLET | Freq: Two times a day (BID) | ORAL | 0 refills | Status: AC

## 2024-10-04 NOTE — ED Triage Notes (Signed)
 Pt reports bilateral ear ringing and pain, generalized body aches, loss of appetite, nasal congestion, sore throat, and mild cough x 3 days. States has tried Nyquil. Reports one of her roommates has the flu and the other roommate has strep

## 2024-10-04 NOTE — ED Provider Notes (Signed)
 [facility]    CSN: [id number] Arrival date & time: 10/04/24  1058      History   Chief Complaint Chief Complaint  Patient presents with   Nasal Congestion   Generalized Body Aches    HPI Cathy Heath is a 18 y.o. female.   Cathy Heath is a 18 y.o. female presenting for chief complaint of Nasal Congestion and Generalized Body Aches that started 3 days ago on Saturday October 01, 2024. Cough is minimally productive and is worse at nighttime. Nasal congestion/drainage is yellow/clear.  She denies nausea, vomiting, diarrhea, abdominal pain, rashes, and dizziness.  No fever or chills.  Her roommate is sick with the flu and her other roommate is sick with strep throat.  She denies recent antibiotic or steroid use and history of asthma or chronic respiratory problems.  Vapes daily, denies history of tobacco abuse.  Taking NyQuil without much relief of cough at nighttime.     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 No pertinent surgical history.  OB History     Gravida  0   Para  0   Term  0   Preterm  0   AB  0   Living  0      SAB  0   IAB  0   Ectopic  0   Multiple  0   Live Births  0            Home Medications    Prior to Admission medications   Medication Sig Start Date End Date Taking? Authorizing Provider  Guaifenesin  1200 MG TB12 Take 1 tablet (1,200 mg total) by mouth in the morning and at bedtime. 10/04/24  Yes Enedelia Dorna HERO, FNP  benzonatate  (TESSALON ) 100 MG capsule Take 1 capsule (100 mg total) by mouth 3 (three) times daily as needed for  cough. 07/07/24   Vonna Sharlet POUR, MD  etonogestrel  (NEXPLANON ) 68 MG IMPL implant 68 mg by subdermal route once.    [provider]  hydrochlorothiazide  (HYDRODIURIL ) 25 MG tablet Take 1 tablet (25 mg total) by mouth daily. 05/24/24   Celestia Rosaline SQUIBB, NP  medroxyPROGESTERone  (PROVERA ) 10 MG tablet Take 1 tablet (10 mg total) by mouth daily for 10 days. 07/07/24 07/17/24  Vonna Sharlet POUR, MD  promethazine -dextromethorphan (PROMETHAZINE -DM) 6.25-15 MG/5ML syrup Take 5 mLs by mouth 4 (four) times daily as needed for cough (every 4-6 hours as needed for cough). 10/04/24   Enedelia Dorna HERO, FNP    Family History Family History  Problem Relation Age of Onset   Healthy Mother    Hypertension Father     Social History Social History   Tobacco Use   Smoking status: Never   Smokeless tobacco: Never  Vaping Use   Vaping status: Every Day  Substance Use Topics   Alcohol use: Yes    Comment: rare   Drug use: Yes    Types: Marijuana    Comment: 1-2 per week     Allergies   Patient has no known allergies.   Review of Systems Review of Systems Per HPI  Physical Exam Triage Vital Signs ED Triage  Vitals  Encounter Vitals Group     BP 10/04/24 1215 129/87     Girls Systolic BP Percentile --      Girls Diastolic BP Percentile --      Boys Systolic BP Percentile --      Boys Diastolic BP Percentile --      Pulse Rate 10/04/24 1215 88     Resp 10/04/24 1215 16     Temp 10/04/24 1215 98 F (36.7 C)     Temp Source 10/04/24 1215 Oral     SpO2 10/04/24 1215 96 %     Weight --      Height --      Head Circumference --      Peak Flow --      Pain Score 10/04/24 1213 4     Pain Loc --      Pain Education --      Exclude from Growth Chart --    No data found.  Updated Vital Signs BP 129/87 (BP Location: Right Arm)   Pulse 88   Temp 98 F (36.7 C) (Oral)   Resp 16   SpO2 96%   Visual Acuity Right Eye Distance:   Left Eye Distance:   Bilateral Distance:     Right Eye Near:   Left Eye Near:    Bilateral Near:     Physical Exam Vitals and nursing note reviewed.  Constitutional:      Appearance: She is not ill-appearing or toxic-appearing.  HENT:     Head: Normocephalic and atraumatic.     Right Ear: Hearing, tympanic membrane, ear canal and external ear normal.     Left Ear: Hearing, tympanic membrane, ear canal and external ear normal.     Nose: Congestion present.     Mouth/Throat:     Lips: Pink.     Mouth: Mucous membranes are moist. No injury or oral lesions.     Dentition: Normal dentition.     Tongue: No lesions.     Pharynx: Oropharynx is clear. Uvula midline. Posterior oropharyngeal erythema present. No pharyngeal swelling, oropharyngeal exudate, uvula swelling or postnasal drip.     Comments: S/P tonsillectomy.  Eyes:     General: Lids are normal. Vision grossly intact. Gaze aligned appropriately.     Extraocular Movements: Extraocular movements intact.     Conjunctiva/sclera: Conjunctivae normal.  Neck:     Trachea: Trachea and phonation normal.  Cardiovascular:     Rate and Rhythm: Normal rate and regular rhythm.     Heart sounds: Normal heart sounds, S1 normal and S2 normal.  Pulmonary:     Effort: Pulmonary effort is normal. No respiratory distress.     Breath sounds: Normal breath sounds and air entry. No wheezing, rhonchi or rales.  Chest:     Chest wall: No tenderness.  Musculoskeletal:     Cervical back: Neck supple.  Lymphadenopathy:     Cervical: No cervical adenopathy.  Skin:    General: Skin is warm and dry.     Capillary Refill: Capillary refill takes less than 2 seconds.     Findings: No rash.  Neurological:     General: No focal deficit present.     Mental Status: She is alert and oriented to person, place, and time. Mental status is at baseline.     Cranial Nerves: No dysarthria or facial asymmetry.  Psychiatric:        Mood and Affect: Mood normal.  Speech: Speech normal.         Behavior: Behavior normal.        Thought Content: Thought content normal.        Judgment: Judgment normal.      UC Treatments / Results  Labs (all labs ordered are listed, but only abnormal results are displayed) Labs Reviewed  POC COVID19/FLU A&B COMBO  POCT RAPID STREP A (OFFICE)    EKG   Radiology No results found.  Procedures Procedures (including critical care time)  Medications Ordered in UC Medications - No data to display  Initial Impression / Assessment and Plan / UC Course  I have reviewed the triage vital signs and the nursing notes.  Pertinent labs & imaging results that were available during my care of the patient were reviewed by me and considered in my medical decision making (see chart for details).   1.  Viral URI with cough, vapes nicotine containing substance Suspect viral URI, viral syndrome.  Strep/viral testing: POC COVID, flu, and strep are all negative.   Physical exam findings reassuring, vital signs hemodynamically stable, and lungs clear, therefore deferred imaging of the chest.  Advised supportive care/prescriptions for symptomatic relief as outlined in AVS.    Counseled patient on potential for adverse effects with medications prescribed/recommended today, strict ER and return-to-clinic precautions discussed, patient verbalized understanding.    Final Clinical Impressions(s) / UC Diagnoses   Final diagnoses:  Viral URI with cough  Vapes nicotine containing substance     Discharge Instructions      You have a viral illness which will improve on its own with rest, fluids, and medications to help with your symptoms.  Tylenol , guaifenesin  (plain mucinex ), and saline nasal sprays may help relieve symptoms.   Two teaspoons of honey in 1 cup of warm water every 4-6 hours may help with throat pains.  Humidifier in room at nighttime may help soothe cough (clean well daily).   Take Promethazine  DM cough medication to help with your  cough at nighttime so that you are able to sleep. Do not drive, drink alcohol, or go to work while taking this medication since it can make you sleepy. Only take this at nighttime.   For chest pain, shortness of breath, inability to keep food or fluids down without vomiting, fever that does not respond to tylenol  or motrin , or any other severe symptoms, please go to the ER for further evaluation. Return to urgent care as needed, otherwise follow-up with PCP.      ED Prescriptions     Medication Sig Dispense Auth. Provider   promethazine -dextromethorphan (PROMETHAZINE -DM) 6.25-15 MG/5ML syrup Take 5 mLs by mouth 4 (four) times daily as needed for cough (every 4-6 hours as needed for cough). 118 mL Enedelia Going M, FNP   Guaifenesin  1200 MG TB12 Take 1 tablet (1,200 mg total) by mouth in the morning and at bedtime. 14 tablet Enedelia Going HERO, FNP      PDMP not reviewed this encounter.   Enedelia Going HERO, [place] 10/04/24 1331

## 2024-10-04 NOTE — Discharge Instructions (Signed)

## 2024-11-30 ENCOUNTER — Encounter (HOSPITAL_COMMUNITY): Payer: Self-pay | Admitting: Emergency Medicine

## 2024-11-30 ENCOUNTER — Other Ambulatory Visit: Payer: Self-pay

## 2024-11-30 ENCOUNTER — Ambulatory Visit (HOSPITAL_COMMUNITY)
Admission: EM | Admit: 2024-11-30 | Discharge: 2024-11-30 | Disposition: A | Discharge status: Home / Self Care | Payer: MEDICAID

## 2024-11-30 DIAGNOSIS — H6690 Otitis media, unspecified, unspecified ear: Secondary | ICD-10-CM

## 2024-11-30 MED ORDER — AMOXICILLIN-POT CLAVULANATE 875-125 MG PO TABS: 1.0000 | ORAL_TABLET | Freq: Two times a day (BID) | ORAL | 0 refills | Status: AC

## 2024-11-30 NOTE — ED Provider Notes (Signed)
 " [facility]    CSN: [id number] Arrival date & time: 11/30/24  1632      History   Chief Complaint Chief Complaint  Patient presents with   Otalgia    HPI Cathy Heath is a 19 y.o. female.   This 19 year old female is being seen for bilateral ear pain and fullness.  She reports symptom onset over the last few days.  She reports she initially had a cold over the weekend and all symptoms have resolved except for ear pain.  She reports history of ear infections when she was younger, last ear infection approximately 10 years ago.  She says this ear pain feels similar to previous ear infections.  She endorses headache and mild nasal congestion.  She denies dizziness, sore throat.  She denies chest pain, cough, shortness of breath, abdominal pain, nausea, vomiting, diarrhea.   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 Authorizing Provider  amoxicillin -clavulanate (AUGMENTIN ) 875-125 MG tablet Take 1 tablet by mouth every 12 (twelve) hours. 11/30/24  Yes Santonio Speakman C, FNP  benzonatate   (TESSALON ) 100 MG capsule Take 1 capsule (100 mg total) by mouth 3 (three) times daily as needed for cough. 07/07/24   Vonna Sharlet POUR, MD  etonogestrel  (NEXPLANON ) 68 MG IMPL implant 68 mg by subdermal route once.    [provider]  Guaifenesin  1200 MG TB12 Take 1 tablet (1,200 mg total) by mouth in the morning and at bedtime. 10/04/24   Enedelia Dorna HERO, FNP  hydrochlorothiazide  (HYDRODIURIL ) 25 MG tablet Take 1 tablet (25 mg total) by mouth daily. 05/24/24   Celestia Rosaline SQUIBB, NP  medroxyPROGESTERone  (PROVERA ) 10 MG tablet Take 1 tablet (10 mg total) by mouth daily for 10 days. 07/07/24 07/17/24  Vonna Sharlet POUR, MD  promethazine -dextromethorphan (PROMETHAZINE -DM) 6.25-15 MG/5ML syrup Take 5 mLs by mouth 4 (four) times daily as needed for cough (every 4-6 hours as needed for cough). 10/04/24   Enedelia Dorna HERO, FNP    Family History Family History  Problem Relation Age of Onset   Healthy Mother    Hypertension Father     Social History Social History[1]   Allergies   Patient has no known allergies.   Review of Systems Review of Systems  Constitutional:  Negative for activity change, appetite change, chills and fever.  HENT:  Positive for congestion  and ear pain. Negative for ear discharge and sore throat.   Respiratory:  Negative for cough and shortness of breath.   Cardiovascular:  Negative for chest pain.  Gastrointestinal:  Negative for abdominal pain, diarrhea, nausea and vomiting.  Skin:  Negative for color change and rash.  Neurological:  Positive for headaches. Negative for dizziness.  All other systems reviewed and are negative.    Physical Exam Triage Vital Signs ED Triage Vitals  Encounter Vitals Group     BP 11/30/24 1800 (!) 138/91     Girls Systolic BP Percentile --      Girls Diastolic BP Percentile --      Boys Systolic BP Percentile --      Boys Diastolic BP Percentile --      Pulse Rate 11/30/24 1800 89     Resp 11/30/24 1800 18      Temp 11/30/24 1800 97.6 F (36.4 C)     Temp Source 11/30/24 1800 Oral     SpO2 11/30/24 1800 96 %     Weight --      Height --      Head Circumference --      Peak Flow --      Pain Score 11/30/24 1756 8     Pain Loc --      Pain Education --      Exclude from Growth Chart --    No data found.  Updated Vital Signs BP (!) 138/91 (BP Location: Right Arm) Comment (BP Location): large cuff  Pulse 89   Temp 97.6 F (36.4 C) (Oral)   Resp 18   LMP 06/13/2024   SpO2 96%   Visual Acuity Right Eye Distance:   Left Eye Distance:   Bilateral Distance:    Right Eye Near:   Left Eye Near:    Bilateral Near:     Physical Exam Vitals and nursing note reviewed.  Constitutional:      General: She is not in acute distress.    Appearance: She is well-developed. She is not toxic-appearing.     Comments: Pleasant female appearing stated age found sitting in chair in no acute distress.  HENT:     Head: Normocephalic and atraumatic.     Right Ear: External ear normal. Tympanic membrane is erythematous and bulging.     Left Ear: Tympanic membrane and external ear normal.     Nose: Congestion present.     Mouth/Throat:     Lips: Pink.     Mouth: Mucous membranes are moist.     Pharynx: No oropharyngeal exudate or posterior oropharyngeal erythema.  Eyes:     General:        Right eye: No discharge.        Left eye: No discharge.     Conjunctiva/sclera: Conjunctivae normal.  Cardiovascular:     Rate and Rhythm: Normal rate and regular rhythm.     Heart sounds: Normal heart sounds. No murmur heard. Pulmonary:     Effort: Pulmonary effort is normal. No respiratory distress.     Breath sounds: Normal breath sounds.  Musculoskeletal:     Cervical back: Neck supple.  Skin:    General: Skin is warm and dry.     Capillary Refill: Capillary refill takes less than 2 seconds.  Neurological:     Mental Status: She is alert.  Psychiatric:        Mood and Affect: Mood normal.       UC Treatments /  Results  Labs (all labs ordered are listed, but only abnormal results are displayed) Labs Reviewed - No data to display  EKG   Radiology No results found.  Procedures Procedures (including critical care time)  Medications Ordered in UC Medications - No data to display  Initial Impression / Assessment and Plan / UC Course  I have reviewed the triage vital signs and the nursing notes.  Pertinent labs & imaging results that were available during my care of the patient were reviewed by me and considered in my medical decision making (see chart for details).     Vitals and triage reviewed, patient is hemodynamically stable.  Presentation consistent with acute otitis media.  She is given a prescription for Augmentin .  Advised supportive care with Tylenol  and/or ibuprofen .  She is given a work note.  Plan of care, follow-up care, return precautions given, no questions at this time. Final Clinical Impressions(s) / UC Diagnoses   Final diagnoses:  Acute otitis media, unspecified otitis media type     Discharge Instructions      You have an ear infection.  Take Augmentin  as prescribed. Take Tylenol  and/or ibuprofen  for fever, pain.  If you develop new or worsening symptoms or if your symptoms do not start to improve, please return here or follow-up with your primary care provider.  If your symptoms are severe, please go to the emergency room.     ED Prescriptions     Medication Sig Dispense Auth. Provider   amoxicillin -clavulanate (AUGMENTIN ) 875-125 MG tablet Take 1 tablet by mouth every 12 (twelve) hours. 14 tablet Galina Haddox C, FNP      PDMP not reviewed this encounter.    [1]  Social History Tobacco Use   Smoking status: Never   Smokeless tobacco: Never  Vaping Use   Vaping status: Every Day  Substance Use Topics   Alcohol use: Yes    Comment: rare   Drug use: Yes    Types: Marijuana    Comment: 1-2 per week     Lennice Jon BROCKS, FNP 11/30/24 1914  "

## 2024-11-30 NOTE — Discharge Instructions (Addendum)
 You have an ear infection.  Take Augmentin  as prescribed. Take Tylenol  and/or ibuprofen  for fever, pain.  If you develop new or worsening symptoms or if your symptoms do not start to improve, please return here or follow-up with your primary care provider.  If your symptoms are severe, please go to the emergency room.

## 2024-11-30 NOTE — ED Triage Notes (Signed)
 Bilateral ear pain and stuffiness .  Recently had a cold  reportedly over the weekend.  Only admits to slight congestion  Patient has otc ear ache drops-but not helping.
